# Patient Record
Sex: Male | Born: 1960 | Race: White | Hispanic: No | Marital: Married | Smoking: Never smoker
Health system: Southern US, Community
[De-identification: ages and names within clinical notes are randomized; demographics above are authoritative.]

## PROBLEM LIST (undated history)

## (undated) ENCOUNTER — Ambulatory Visit: Admission: EM

## (undated) DIAGNOSIS — G3185 Corticobasal degeneration: Secondary | ICD-10-CM

## (undated) DIAGNOSIS — K219 Gastro-esophageal reflux disease without esophagitis: Secondary | ICD-10-CM

## (undated) DIAGNOSIS — I10 Essential (primary) hypertension: Secondary | ICD-10-CM

## (undated) DIAGNOSIS — M2550 Pain in unspecified joint: Secondary | ICD-10-CM

## (undated) HISTORY — DX: Corticobasal degeneration: G31.85

## (undated) HISTORY — DX: Pain in unspecified joint: M25.50

## (undated) HISTORY — PX: HERNIA REPAIR: SHX51

## (undated) HISTORY — DX: Gastro-esophageal reflux disease without esophagitis: K21.9

---

## 2007-03-17 ENCOUNTER — Ambulatory Visit: Payer: Self-pay | Admitting: Surgery

## 2007-03-24 ENCOUNTER — Ambulatory Visit: Payer: Self-pay | Admitting: Surgery

## 2008-09-21 ENCOUNTER — Ambulatory Visit: Payer: Self-pay | Admitting: Internal Medicine

## 2012-12-20 ENCOUNTER — Ambulatory Visit: Payer: Self-pay | Admitting: Physician Assistant

## 2019-09-03 ENCOUNTER — Other Ambulatory Visit: Payer: Self-pay

## 2019-09-03 ENCOUNTER — Ambulatory Visit
Admission: EM | Admit: 2019-09-03 | Discharge: 2019-09-03 | Disposition: A | Discharge status: Home / Self Care | Payer: BC Managed Care – PPO | Attending: Physician Assistant | Admitting: Physician Assistant

## 2019-09-03 DIAGNOSIS — Z20822 Contact with and (suspected) exposure to covid-19: Secondary | ICD-10-CM

## 2019-09-03 LAB — SARS CORONAVIRUS 2 (TAT 6-24 HRS): SARS Coronavirus 2: NEGATIVE

## 2019-09-03 NOTE — Discharge Instructions (Signed)
Test will return in 6-24 hours. You will not contacted for negative results. Continue to quarantine until results are available.

## 2019-09-03 NOTE — ED Triage Notes (Signed)
Pt present to [facility] for covid testing. He states his grandson tested positive and he lives in his home. Pt is Asymptotic.

## 2020-04-13 DIAGNOSIS — I10 Essential (primary) hypertension: Secondary | ICD-10-CM | POA: Insufficient documentation

## 2020-05-26 DIAGNOSIS — K219 Gastro-esophageal reflux disease without esophagitis: Secondary | ICD-10-CM | POA: Insufficient documentation

## 2020-10-27 ENCOUNTER — Other Ambulatory Visit: Payer: Self-pay

## 2020-10-27 ENCOUNTER — Encounter: Payer: Self-pay | Admitting: Emergency Medicine

## 2020-10-27 ENCOUNTER — Ambulatory Visit
Admission: EM | Admit: 2020-10-27 | Discharge: 2020-10-27 | Disposition: A | Discharge status: Home / Self Care | Payer: BC Managed Care – PPO

## 2020-10-27 DIAGNOSIS — H532 Diplopia: Secondary | ICD-10-CM

## 2020-10-27 DIAGNOSIS — Z87828 Personal history of other (healed) physical injury and trauma: Secondary | ICD-10-CM | POA: Diagnosis not present

## 2020-10-27 DIAGNOSIS — R42 Dizziness and giddiness: Secondary | ICD-10-CM | POA: Diagnosis not present

## 2020-10-27 HISTORY — DX: Essential (primary) hypertension: I10
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
## 2020-10-27 NOTE — Discharge Instructions (Signed)
Please go back to the ER to have further work up Make sure you follow up with [employer] comp clinic your company prefers. We dont do worker's comp follow ups here.
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
## 2020-10-27 NOTE — ED Notes (Signed)
Patient is being discharged from the Urgent Care and sent to the Emergency Department via POV . Per Beatrix Fetters, PA, patient is in need of higher level of care due to Blurred vision and recent head trauma. Patient is aware and verbalizes understanding of plan of care.  Vitals:   10/27/20 0909  BP: (!) 162/107  Pulse: 63  Resp: 18  Temp: 98.1 F (36.7 C)  SpO2: 97%

## 2021-01-22 DIAGNOSIS — M179 Osteoarthritis of knee, unspecified: Secondary | ICD-10-CM | POA: Insufficient documentation

## 2021-02-12 DIAGNOSIS — R7303 Prediabetes: Secondary | ICD-10-CM | POA: Insufficient documentation

## 2021-02-17 DIAGNOSIS — M1712 Unilateral primary osteoarthritis, left knee: Secondary | ICD-10-CM | POA: Insufficient documentation

## 2021-02-17 DIAGNOSIS — M545 Low back pain, unspecified: Secondary | ICD-10-CM | POA: Insufficient documentation

## 2021-04-02 ENCOUNTER — Encounter: Payer: Self-pay | Admitting: *Deleted

## 2021-04-03 ENCOUNTER — Ambulatory Visit: Payer: BC Managed Care – PPO | Admitting: Neurology

## 2021-04-03 ENCOUNTER — Encounter: Payer: Self-pay | Admitting: Neurology

## 2021-04-03 VITALS — BP 150/104 | HR 80 | Ht 66.0 in | Wt 176.0 lb

## 2021-04-03 DIAGNOSIS — R269 Unspecified abnormalities of gait and mobility: Secondary | ICD-10-CM

## 2021-04-03 DIAGNOSIS — G8194 Hemiplegia, unspecified affecting left nondominant side: Secondary | ICD-10-CM | POA: Diagnosis not present
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
## 2021-04-06 ENCOUNTER — Telehealth: Payer: Self-pay | Admitting: Neurology

## 2021-04-06 NOTE — Telephone Encounter (Signed)
At 1:59 pt left a vm asking for a call to discuss returning to work before PT is over, please call. ?

## 2021-04-07 NOTE — Telephone Encounter (Signed)
I spoke to the patient. He has only had one PT session. Continued weakness on left side and gait issues. Double vision has improved. He has not scheduled his MRI brain or MRI cervical yet (scans still pending approval). 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 For now, he should remain out of work in this position for safety reasons. He will move forward with the MRI's, PT and will keep his follow up on 05/18/21. He would like to speak more about work clearance at the next office visit.  ?

## 2021-04-09 ENCOUNTER — Ambulatory Visit: Payer: BC Managed Care – PPO

## 2021-04-13 ENCOUNTER — Encounter: Payer: Self-pay | Admitting: Physical Therapy

## 2021-04-13 ENCOUNTER — Other Ambulatory Visit: Payer: Self-pay

## 2021-04-13 ENCOUNTER — Ambulatory Visit: Payer: BC Managed Care – PPO | Attending: Neurology | Admitting: Physical Therapy

## 2021-04-13 ENCOUNTER — Telehealth: Payer: Self-pay | Admitting: Neurology

## 2021-04-13 DIAGNOSIS — R2689 Other abnormalities of gait and mobility: Secondary | ICD-10-CM | POA: Insufficient documentation

## 2021-04-13 DIAGNOSIS — R262 Difficulty in walking, not elsewhere classified: Secondary | ICD-10-CM | POA: Diagnosis present

## 2021-04-13 DIAGNOSIS — R269 Unspecified abnormalities of gait and mobility: Secondary | ICD-10-CM | POA: Diagnosis present

## 2021-04-13 DIAGNOSIS — G8194 Hemiplegia, unspecified affecting left nondominant side: Secondary | ICD-10-CM | POA: Insufficient documentation
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
## 2021-04-13 NOTE — Telephone Encounter (Signed)
spoke to the pt he would like to go to go closer to home sent to [facility]  ?[facility] Numbers: [id number] (exp. 04/08/21 to 05/07/21)  ?

## 2021-04-15 ENCOUNTER — Other Ambulatory Visit: Payer: Self-pay

## 2021-04-15 ENCOUNTER — Telehealth: Payer: Self-pay

## 2021-04-15 ENCOUNTER — Encounter: Payer: Self-pay | Admitting: Physical Therapy

## 2021-04-15 ENCOUNTER — Ambulatory Visit: Payer: BC Managed Care – PPO | Admitting: Physical Therapy

## 2021-04-15 DIAGNOSIS — R2689 Other abnormalities of gait and mobility: Secondary | ICD-10-CM

## 2021-04-15 DIAGNOSIS — R262 Difficulty in walking, not elsewhere classified: Secondary | ICD-10-CM

## 2021-04-15 DIAGNOSIS — Z0289 Encounter for other administrative examinations: Secondary | ICD-10-CM

## 2021-04-15 DIAGNOSIS — R269 Unspecified abnormalities of gait and mobility: Secondary | ICD-10-CM

## 2021-04-15 DIAGNOSIS — G8194 Hemiplegia, unspecified affecting left nondominant side: Secondary | ICD-10-CM | POA: Diagnosis not present

## 2021-04-15 NOTE — Telephone Encounter (Signed)
Completed [employer] forms have been copied and given to medical records for processing.  ?
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
## 2021-04-16 NOTE — Telephone Encounter (Signed)
Pt Dot form ready @ the front desk for p/u. ?

## 2021-04-20 ENCOUNTER — Ambulatory Visit: Payer: BC Managed Care – PPO | Attending: Neurology | Admitting: Physical Therapy

## 2021-04-20 ENCOUNTER — Encounter: Payer: Self-pay | Admitting: Physical Therapy

## 2021-04-20 DIAGNOSIS — R2689 Other abnormalities of gait and mobility: Secondary | ICD-10-CM | POA: Diagnosis present

## 2021-04-20 DIAGNOSIS — R269 Unspecified abnormalities of gait and mobility: Secondary | ICD-10-CM | POA: Insufficient documentation

## 2021-04-20 DIAGNOSIS — R262 Difficulty in walking, not elsewhere classified: Secondary | ICD-10-CM | POA: Insufficient documentation
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
## 2021-04-22 ENCOUNTER — Ambulatory Visit
Admission: RE | Admit: 2021-04-22 | Discharge: 2021-04-22 | Disposition: A | Discharge status: Home / Self Care | Payer: BC Managed Care – PPO | Source: Ambulatory Visit | Attending: Neurology | Admitting: Neurology

## 2021-04-22 ENCOUNTER — Telehealth: Payer: Self-pay | Admitting: Neurology

## 2021-04-22 ENCOUNTER — Encounter: Payer: BC Managed Care – PPO | Admitting: Physical Therapy

## 2021-04-22 DIAGNOSIS — G8194 Hemiplegia, unspecified affecting left nondominant side: Secondary | ICD-10-CM

## 2021-04-22 DIAGNOSIS — R269 Unspecified abnormalities of gait and mobility: Secondary | ICD-10-CM

## 2021-04-22 NOTE — Telephone Encounter (Signed)
Reviewed MRI of the brain and cervical spine showed no significant abnormalities. ? ?IMPRESSION: This MRI of the brain without contrast shows the following: ?1.   No acute findings or explanation for his symptoms. ?2.   Brain parenchyma is normal for age showing just a couple punctate T2/FLAIR hyperintense foci in the subcortical white matter of the frontal lobes that could be due to minimal chronic microvascular ischemic change. ?3.   Chronic right maxillary and ethmoid sinusitis. ?   ?IMPRESSION: This MRI of the cervical spine without contrast shows the following: ?1.   The spinal cord appears normal. ?2.   Mild multilevel degenerative changes as detailed above leading to mild spinal stenosis at C6-C7 and borderline spinal stenosis at C5-C6.  There is no nerve root compression at any of these levels. ?

## 2021-04-27 ENCOUNTER — Ambulatory Visit: Payer: BC Managed Care – PPO | Admitting: Physical Therapy

## 2021-04-27 ENCOUNTER — Encounter: Payer: Self-pay | Admitting: Physical Therapy

## 2021-04-27 DIAGNOSIS — R269 Unspecified abnormalities of gait and mobility: Secondary | ICD-10-CM | POA: Diagnosis not present

## 2021-04-27 DIAGNOSIS — R2689 Other abnormalities of gait and mobility: Secondary | ICD-10-CM

## 2021-04-27 DIAGNOSIS — R262 Difficulty in walking, not elsewhere classified: Secondary | ICD-10-CM
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
## 2021-04-28 ENCOUNTER — Other Ambulatory Visit: Payer: BC Managed Care – PPO

## 2021-04-29 ENCOUNTER — Encounter: Payer: Self-pay | Admitting: Physical Therapy

## 2021-04-29 ENCOUNTER — Ambulatory Visit: Payer: BC Managed Care – PPO | Admitting: Physical Therapy

## 2021-04-29 DIAGNOSIS — R262 Difficulty in walking, not elsewhere classified: Secondary | ICD-10-CM

## 2021-04-29 DIAGNOSIS — R269 Unspecified abnormalities of gait and mobility: Secondary | ICD-10-CM | POA: Diagnosis not present

## 2021-04-29 DIAGNOSIS — R2689 Other abnormalities of gait and mobility: Secondary | ICD-10-CM
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
## 2021-05-04 ENCOUNTER — Encounter: Payer: Self-pay | Admitting: Physical Therapy

## 2021-05-04 ENCOUNTER — Ambulatory Visit: Payer: BC Managed Care – PPO | Admitting: Physical Therapy

## 2021-05-04 DIAGNOSIS — R262 Difficulty in walking, not elsewhere classified: Secondary | ICD-10-CM

## 2021-05-04 DIAGNOSIS — R2689 Other abnormalities of gait and mobility: Secondary | ICD-10-CM

## 2021-05-04 DIAGNOSIS — R269 Unspecified abnormalities of gait and mobility: Secondary | ICD-10-CM | POA: Diagnosis not present
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
## 2021-05-06 ENCOUNTER — Ambulatory Visit: Payer: BC Managed Care – PPO | Admitting: Physical Therapy

## 2021-05-06 ENCOUNTER — Encounter: Payer: Self-pay | Admitting: Physical Therapy

## 2021-05-06 DIAGNOSIS — R269 Unspecified abnormalities of gait and mobility: Secondary | ICD-10-CM

## 2021-05-06 DIAGNOSIS — R2689 Other abnormalities of gait and mobility: Secondary | ICD-10-CM

## 2021-05-06 DIAGNOSIS — R262 Difficulty in walking, not elsewhere classified: Secondary | ICD-10-CM
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
## 2021-05-11 ENCOUNTER — Encounter: Payer: BC Managed Care – PPO | Admitting: Physical Therapy

## 2021-05-12 ENCOUNTER — Encounter: Payer: Self-pay | Admitting: Physical Therapy

## 2021-05-12 ENCOUNTER — Ambulatory Visit: Payer: BC Managed Care – PPO | Admitting: Physical Therapy

## 2021-05-12 DIAGNOSIS — R269 Unspecified abnormalities of gait and mobility: Secondary | ICD-10-CM

## 2021-05-12 DIAGNOSIS — R262 Difficulty in walking, not elsewhere classified: Secondary | ICD-10-CM

## 2021-05-12 DIAGNOSIS — R2689 Other abnormalities of gait and mobility: Secondary | ICD-10-CM
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
## 2021-05-13 ENCOUNTER — Ambulatory Visit: Payer: BC Managed Care – PPO | Admitting: Physical Therapy

## 2021-05-13 ENCOUNTER — Encounter: Payer: Self-pay | Admitting: Physical Therapy

## 2021-05-13 VITALS — BP 133/97 | HR 81

## 2021-05-13 DIAGNOSIS — R262 Difficulty in walking, not elsewhere classified: Secondary | ICD-10-CM

## 2021-05-13 DIAGNOSIS — R2689 Other abnormalities of gait and mobility: Secondary | ICD-10-CM

## 2021-05-13 DIAGNOSIS — R269 Unspecified abnormalities of gait and mobility: Secondary | ICD-10-CM | POA: Diagnosis not present
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
## 2021-05-18 ENCOUNTER — Ambulatory Visit: Payer: BC Managed Care – PPO | Attending: Neurology | Admitting: Physical Therapy

## 2021-05-18 ENCOUNTER — Encounter: Payer: Self-pay | Admitting: Neurology

## 2021-05-18 ENCOUNTER — Encounter: Payer: Self-pay | Admitting: Physical Therapy

## 2021-05-18 ENCOUNTER — Ambulatory Visit: Payer: BC Managed Care – PPO | Admitting: Neurology

## 2021-05-18 VITALS — BP 161/97 | HR 76 | Ht 66.0 in | Wt 171.5 lb

## 2021-05-18 DIAGNOSIS — G8194 Hemiplegia, unspecified affecting left nondominant side: Secondary | ICD-10-CM | POA: Diagnosis not present

## 2021-05-18 DIAGNOSIS — R269 Unspecified abnormalities of gait and mobility: Secondary | ICD-10-CM

## 2021-05-18 DIAGNOSIS — R2689 Other abnormalities of gait and mobility: Secondary | ICD-10-CM | POA: Diagnosis present

## 2021-05-18 DIAGNOSIS — G20 Parkinson's disease: Secondary | ICD-10-CM | POA: Diagnosis not present

## 2021-05-18 DIAGNOSIS — R262 Difficulty in walking, not elsewhere classified: Secondary | ICD-10-CM | POA: Insufficient documentation
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
## 2021-05-19 DIAGNOSIS — Z0289 Encounter for other administrative examinations: Secondary | ICD-10-CM

## 2021-05-20 ENCOUNTER — Encounter: Payer: Self-pay | Admitting: Physical Therapy

## 2021-05-20 ENCOUNTER — Ambulatory Visit: Payer: BC Managed Care – PPO | Admitting: Physical Therapy

## 2021-05-20 DIAGNOSIS — R269 Unspecified abnormalities of gait and mobility: Secondary | ICD-10-CM | POA: Diagnosis not present

## 2021-05-20 DIAGNOSIS — R262 Difficulty in walking, not elsewhere classified: Secondary | ICD-10-CM

## 2021-05-20 DIAGNOSIS — R2689 Other abnormalities of gait and mobility: Secondary | ICD-10-CM
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
## 2021-05-21 ENCOUNTER — Telehealth: Payer: Self-pay | Admitting: *Deleted

## 2021-05-21 ENCOUNTER — Telehealth: Payer: Self-pay | Admitting: Neurology

## 2021-05-21 NOTE — Telephone Encounter (Signed)
Please call patient, laboratory evaluation showed ? ?-- Mild elevated IgM, unknown clinical significance ? ?--Mildly decreased vitamin D 26.5, he would benefit over-the-counter D3 supplement, 1000 units daily ? ?--Mild elevated CPK 547, unknown clinical significance, encouraging increase water intake, will consider repeat CPK at next office visit ? ?Rest of the laboratory evaluation showed no significant abnormalities. ? ?

## 2021-05-21 NOTE — Telephone Encounter (Signed)
Spoke with pt and provided results. He was agreeable to starting Vitamin D 1000 units daily. Informed pt to increase water intake. He verbalized understanding and expressed appreciation for the call. ?

## 2021-05-21 NOTE — Telephone Encounter (Signed)
Pt fmla form @ front desk for p/u 

## 2021-05-25 ENCOUNTER — Encounter: Payer: Self-pay | Admitting: Physical Therapy

## 2021-05-25 ENCOUNTER — Telehealth: Payer: Self-pay | Admitting: Neurology

## 2021-05-25 ENCOUNTER — Ambulatory Visit: Payer: BC Managed Care – PPO | Admitting: Physical Therapy

## 2021-05-25 DIAGNOSIS — R262 Difficulty in walking, not elsewhere classified: Secondary | ICD-10-CM

## 2021-05-25 DIAGNOSIS — R2689 Other abnormalities of gait and mobility: Secondary | ICD-10-CM

## 2021-05-25 DIAGNOSIS — G20 Parkinson's disease: Secondary | ICD-10-CM | POA: Insufficient documentation

## 2021-05-25 DIAGNOSIS — R269 Unspecified abnormalities of gait and mobility: Secondary | ICD-10-CM | POA: Diagnosis not present

## 2021-05-25 NOTE — Telephone Encounter (Signed)
Datscan to evaluation left side difficulty rule out dopamine deficiency ?
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
## 2021-05-25 NOTE — Telephone Encounter (Signed)
[employer] Berkley Harvey: [id number] (exp. 05/25/21 to 06/23/21) order sent to GI, they will reach out to the patient to schedule.  ?

## 2021-05-27 ENCOUNTER — Ambulatory Visit: Payer: BC Managed Care – PPO | Admitting: Physical Therapy

## 2021-05-27 ENCOUNTER — Encounter: Payer: Self-pay | Admitting: Physical Therapy

## 2021-05-27 DIAGNOSIS — R262 Difficulty in walking, not elsewhere classified: Secondary | ICD-10-CM

## 2021-05-27 DIAGNOSIS — R269 Unspecified abnormalities of gait and mobility: Secondary | ICD-10-CM | POA: Diagnosis not present

## 2021-05-27 DIAGNOSIS — R2689 Other abnormalities of gait and mobility: Secondary | ICD-10-CM
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
## 2021-06-01 NOTE — Telephone Encounter (Signed)
Ezequiel Essex Berkley Harvey: [phone] (exp. 06/01/21 to 06/30/21). Sent to Nuclear medicine [facility] will reach out to the patient to schedule.  ?

## 2021-06-02 ENCOUNTER — Ambulatory Visit (INDEPENDENT_AMBULATORY_CARE_PROVIDER_SITE_OTHER): Payer: BC Managed Care – PPO | Admitting: Neurology

## 2021-06-02 ENCOUNTER — Ambulatory Visit: Payer: BC Managed Care – PPO | Admitting: Neurology

## 2021-06-02 ENCOUNTER — Ambulatory Visit: Payer: BC Managed Care – PPO | Admitting: Physical Therapy

## 2021-06-02 VITALS — BP 174/100 | HR 96

## 2021-06-02 DIAGNOSIS — Z0289 Encounter for other administrative examinations: Secondary | ICD-10-CM

## 2021-06-02 DIAGNOSIS — G20 Parkinson's disease: Secondary | ICD-10-CM

## 2021-06-02 DIAGNOSIS — R269 Unspecified abnormalities of gait and mobility: Secondary | ICD-10-CM

## 2021-06-02 DIAGNOSIS — G8194 Hemiplegia, unspecified affecting left nondominant side: Secondary | ICD-10-CM | POA: Diagnosis not present

## 2021-06-02 DIAGNOSIS — R748 Abnormal levels of other serum enzymes: Secondary | ICD-10-CM | POA: Diagnosis not present

## 2021-06-02 MED ORDER — CARBIDOPA-LEVODOPA 25-100 MG PO TABS: 1.0000 | ORAL_TABLET | Freq: Three times a day (TID) | ORAL | 6 refills | Status: AC
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
## 2021-06-02 NOTE — Procedures (Signed)
? ? ? ?   ?Full Name: Wyatt Medina Gender: Male ?MRN #: [id number] Date of Birth: 07/29/1960 ?   ?Visit Date: 06/02/2021 13:38 ?Age: 61 Years ?Examining Physician: Levert Feinstein ?Referring Physician: Levert Feinstein ?Height: 5 feet 6 inch ?Patient History: Elevated CPK, weakness ?History: 61 year old male with elevated CPK, slow worsening left upper and lower extremity weakness ? ?Summary of the test: ?Nerve conduction study: ?Left ulnar sensory and motor responses were normal.  Left median sensory response showed mildly prolonged peak latency with normal snap amplitude.  Left median motor responses were normal. ? ?Left sural, superficial peroneal sensory responses were normal.  Left tibial motor responses were normal. ? ?Electromyography: ?Selected needle examinations of left upper, lower extremity muscles, left cervical and lumbosacral paraspinal muscles were normal. ? ?Conclusion: ?This is a mild abnormal study.  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  There is no evidence of intrinsic muscle disease, left cervical lumbar sacral radiculopathy. ? ? ? ?------------------------------- ?Levert Feinstein, M.D. PhD ? ?[facility] ?[address], [address] ?[place], [place] [address] ?Tel: [phone] ?Fax: [phone] ? ?Verbal informed consent was obtained from the patient, patient was informed of potential risk of procedure, including bruising, bleeding, hematoma formation, infection, muscle weakness, muscle pain, numbness, among others. ?   ? ?   ?MNC ?   ?Nerve / Sites Muscle Latency Ref. Amplitude Ref. Rel Amp Segments Distance Velocity Ref. Area  ?  ms ms mV mV %  cm m/s m/s mVms  ?L Median - APB  ?   Wrist APB 4.1 ?4.4 8.9 ?4.0 100 Wrist - APB 7   29.1  ?   Upper arm APB 8.3  8.5  96.2 Upper arm - Wrist 22 52 ?49 28.9  ?L Ulnar - ADM  ?   Wrist ADM 2.5 ?3.3 6.3 ?6.0 100 Wrist - ADM 7   19.8  ?   B.Elbow ADM 4.7  5.9   94.1 B.Elbow - Wrist 13 61 ?49 21.2  ?   A.Elbow ADM 7.9  6.3  107 A.Elbow - B.Elbow 18 56 ?49 23.4  ?L Tibial - AH  ?   Ankle AH 5.4 ?5.8 11.8 ?4.0 100 Ankle - AH 9   31.8  ?   Pop fossa AH 15.1  8.6  72.6 Pop fossa - Ankle 44 46 ?41 27.6  ?         ?SNC ?   ?Nerve / Sites Rec. Site Peak Lat Ref.  Amp Ref. Segments Distance  ?  ms ms ?V ?V  cm  ?L Sural - Ankle (Calf)  ?   Calf Ankle 4.3 ?4.4 10 ?6 Calf - Ankle 14  ?L Superficial peroneal - Ankle  ?   Lat leg Ankle 4.2 ?4.4 7 ?6 Lat leg - Ankle 14  ?L Median - Orthodromic (Dig II, Mid palm)  ?   Dig II Wrist 3.7 ?3.4 11 ?10 Dig II - Wrist 13  ?L Ulnar - Orthodromic, (Dig V, Mid palm)  ?   Dig V Wrist 2.7 ?3.1 11 ?5 Dig V - Wrist 11  ?           ?F  Wave ?   ?Nerve F Lat Ref.  ? ms ms  ?L Ulnar - ADM 25.1 ?32.0  ?L Tibial - AH 54.3 ?56.0  ?       ?EMG Summary Table   ? Spontaneous MUAP Recruitment  ?  Muscle IA Fib PSW Fasc Other Amp Dur. Poly Pattern  ?L. Tibialis posterior Normal None None None _______ Normal Normal Normal Normal  ?L. Tibialis anterior Normal None None None _______ Normal Normal Normal Normal  ?L. Peroneus longus Normal None None None _______ Normal Normal Normal Normal  ?L. Gastrocnemius (Medial head) Normal None None None _______ Normal Normal Normal Normal  ?L. Vastus lateralis Normal None None None _______ Normal Normal Normal Normal  ?L. Lumbar paraspinals (low) Normal None None None _______ Normal Normal Normal Normal  ?L. Lumbar paraspinals (mid) Normal None None None _______ Normal Normal Normal Normal  ?L. First dorsal interosseous Normal None None None _______ Normal Normal Normal Normal  ?L. Brachioradialis Normal None None None _______ Normal Normal Normal Normal  ?L. Pronator teres Normal None None None _______ Normal Normal Normal Normal  ?L. Biceps brachii Normal None None None _______ Normal Normal Normal Normal  ?L. Deltoid Normal None None None _______ Normal Normal Normal Normal  ?L. Triceps brachii Normal None None None _______  Normal Normal Normal Normal  ?L. Cervical paraspinals Normal None None None _______ Normal Normal Normal Normal  ? ?  ?

## 2021-06-03 ENCOUNTER — Telehealth: Payer: Self-pay

## 2021-06-03 ENCOUNTER — Ambulatory Visit
Admission: RE | Admit: 2021-06-03 | Discharge: 2021-06-03 | Disposition: A | Discharge status: Home / Self Care | Payer: BC Managed Care – PPO | Source: Ambulatory Visit | Attending: Neurology | Admitting: Neurology

## 2021-06-03 DIAGNOSIS — G8194 Hemiplegia, unspecified affecting left nondominant side: Secondary | ICD-10-CM

## 2021-06-03 DIAGNOSIS — R269 Unspecified abnormalities of gait and mobility: Secondary | ICD-10-CM

## 2021-06-03 LAB — CK: Total CK: 632 U/L (ref 41–331)

## 2021-06-03 NOTE — Telephone Encounter (Signed)
Patients [place] disability form completed and faxed ?

## 2021-06-04 ENCOUNTER — Encounter: Payer: Self-pay | Admitting: Physical Therapy

## 2021-06-04 ENCOUNTER — Ambulatory Visit: Payer: BC Managed Care – PPO | Admitting: Physical Therapy

## 2021-06-04 DIAGNOSIS — R269 Unspecified abnormalities of gait and mobility: Secondary | ICD-10-CM

## 2021-06-04 DIAGNOSIS — R2689 Other abnormalities of gait and mobility: Secondary | ICD-10-CM

## 2021-06-04 DIAGNOSIS — R262 Difficulty in walking, not elsewhere classified: Secondary | ICD-10-CM
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
## 2021-06-08 ENCOUNTER — Ambulatory Visit: Payer: BC Managed Care – PPO | Admitting: Physical Therapy

## 2021-06-08 ENCOUNTER — Encounter: Payer: Self-pay | Admitting: Physical Therapy

## 2021-06-08 DIAGNOSIS — R269 Unspecified abnormalities of gait and mobility: Secondary | ICD-10-CM | POA: Diagnosis not present

## 2021-06-08 DIAGNOSIS — R262 Difficulty in walking, not elsewhere classified: Secondary | ICD-10-CM

## 2021-06-08 DIAGNOSIS — R2689 Other abnormalities of gait and mobility: Secondary | ICD-10-CM
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
## 2021-06-10 ENCOUNTER — Ambulatory Visit: Payer: BC Managed Care – PPO | Admitting: Physical Therapy

## 2021-06-10 ENCOUNTER — Encounter: Payer: Self-pay | Admitting: Physical Therapy

## 2021-06-10 DIAGNOSIS — R269 Unspecified abnormalities of gait and mobility: Secondary | ICD-10-CM | POA: Diagnosis not present

## 2021-06-10 DIAGNOSIS — R262 Difficulty in walking, not elsewhere classified: Secondary | ICD-10-CM

## 2021-06-10 DIAGNOSIS — R2689 Other abnormalities of gait and mobility: Secondary | ICD-10-CM
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
## 2021-06-12 LAB — MULTIPLE MYELOMA PANEL, SERUM
Albumin SerPl Elph-Mcnc: 4.1 g/dL (ref 2.9–4.4)
Albumin/Glob SerPl: 1.3 (ref 0.7–1.7)
Alpha 1: 0.3 g/dL (ref 0.0–0.4)
Alpha2 Glob SerPl Elph-Mcnc: 0.7 g/dL (ref 0.4–1.0)
B-Globulin SerPl Elph-Mcnc: 1.2 g/dL (ref 0.7–1.3)
Gamma Glob SerPl Elph-Mcnc: 1.2 g/dL (ref 0.4–1.8)
Globulin, Total: 3.4 g/dL (ref 2.2–3.9)
IgA/Immunoglobulin A, Serum: 226 mg/dL (ref 90–386)
IgG (Immunoglobin G), Serum: 1166 mg/dL (ref 603–1613)
IgM (Immunoglobulin M), Srm: 197 mg/dL — ABNORMAL HIGH (ref 20–172)
Total Protein: 7.5 g/dL (ref 6.0–8.5)

## 2021-06-12 LAB — THYROID PANEL WITH TSH
Free Thyroxine Index: 1.7 (ref 1.2–4.9)
T3 Uptake Ratio: 23 % — ABNORMAL LOW (ref 24–39)
T4, Total: 7.5 ug/dL (ref 4.5–12.0)
TSH: 3.83 u[IU]/mL (ref 0.450–4.500)

## 2021-06-12 LAB — VITAMIN B12: Vitamin B-12: 367 pg/mL (ref 232–1245)

## 2021-06-12 LAB — ACETYLCHOLINE RECEPTOR AB, ALL
AChR Binding Ab, Serum: 0.18 nmol/L (ref 0.00–0.24)
AChR-modulating Ab: 17 % (ref 0–45)
Acetylchol Block Ab: 19 % (ref 0–25)

## 2021-06-12 LAB — CK: Total CK: 547 U/L (ref 41–331)

## 2021-06-12 LAB — HIV ANTIBODY (ROUTINE TESTING W REFLEX): HIV Screen 4th Generation wRfx: NONREACTIVE

## 2021-06-12 LAB — LYME DISEASE SEROLOGY W/REFLEX: Lyme Total Antibody EIA: NEGATIVE

## 2021-06-12 LAB — SEDIMENTATION RATE: Sed Rate: 2 mm/hr (ref 0–30)

## 2021-06-12 LAB — RPR: RPR Ser Ql: NONREACTIVE

## 2021-06-12 LAB — C-REACTIVE PROTEIN: CRP: <1 mg/L (ref 0–10)

## 2021-06-12 LAB — ANA W/REFLEX: Anti Nuclear Antibody (ANA): NEGATIVE

## 2021-06-12 LAB — VITAMIN D 25 HYDROXY (VIT D DEFICIENCY, FRACTURES): Vit D, 25-Hydroxy: 26.5 ng/mL — ABNORMAL LOW (ref 30.0–100.0)

## 2021-06-12 LAB — CERULOPLASMIN: Ceruloplasmin: 22 mg/dL (ref 16.0–31.0)

## 2021-06-16 ENCOUNTER — Ambulatory Visit: Payer: BC Managed Care – PPO | Admitting: Physical Therapy

## 2021-06-16 ENCOUNTER — Encounter: Payer: Self-pay | Admitting: Physical Therapy

## 2021-06-16 DIAGNOSIS — R262 Difficulty in walking, not elsewhere classified: Secondary | ICD-10-CM

## 2021-06-16 DIAGNOSIS — R269 Unspecified abnormalities of gait and mobility: Secondary | ICD-10-CM | POA: Diagnosis not present

## 2021-06-16 DIAGNOSIS — R2689 Other abnormalities of gait and mobility: Secondary | ICD-10-CM
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
## 2021-06-18 ENCOUNTER — Ambulatory Visit: Payer: BC Managed Care – PPO | Attending: Neurology | Admitting: Physical Therapy

## 2021-06-18 ENCOUNTER — Encounter: Payer: Self-pay | Admitting: Physical Therapy

## 2021-06-18 DIAGNOSIS — R269 Unspecified abnormalities of gait and mobility: Secondary | ICD-10-CM | POA: Insufficient documentation

## 2021-06-18 DIAGNOSIS — R2689 Other abnormalities of gait and mobility: Secondary | ICD-10-CM | POA: Insufficient documentation

## 2021-06-18 DIAGNOSIS — R262 Difficulty in walking, not elsewhere classified: Secondary | ICD-10-CM | POA: Diagnosis present
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
## 2021-06-22 ENCOUNTER — Encounter: Payer: Self-pay | Admitting: Physical Therapy

## 2021-06-22 ENCOUNTER — Ambulatory Visit: Payer: BC Managed Care – PPO | Admitting: Physical Therapy

## 2021-06-22 DIAGNOSIS — R262 Difficulty in walking, not elsewhere classified: Secondary | ICD-10-CM

## 2021-06-22 DIAGNOSIS — R2689 Other abnormalities of gait and mobility: Secondary | ICD-10-CM

## 2021-06-22 DIAGNOSIS — R269 Unspecified abnormalities of gait and mobility: Secondary | ICD-10-CM
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
## 2021-06-23 ENCOUNTER — Encounter (HOSPITAL_COMMUNITY)
Admission: RE | Admit: 2021-06-23 | Discharge: 2021-06-23 | Disposition: A | Discharge status: Home / Self Care | Payer: BC Managed Care – PPO | Source: Ambulatory Visit | Attending: Neurology | Admitting: Neurology

## 2021-06-23 DIAGNOSIS — G8194 Hemiplegia, unspecified affecting left nondominant side: Secondary | ICD-10-CM | POA: Diagnosis present

## 2021-06-23 DIAGNOSIS — R2689 Other abnormalities of gait and mobility: Secondary | ICD-10-CM | POA: Insufficient documentation

## 2021-06-23 DIAGNOSIS — R531 Weakness: Secondary | ICD-10-CM | POA: Insufficient documentation

## 2021-06-23 DIAGNOSIS — R269 Unspecified abnormalities of gait and mobility: Secondary | ICD-10-CM | POA: Diagnosis present

## 2021-06-23 MED ORDER — POTASSIUM IODIDE (ANTIDOTE) 130 MG PO TABS
ORAL_TABLET | ORAL | Status: AC
  Filled 2021-06-23: qty 1

## 2021-06-24 MED ORDER — IOFLUPANE I 123 185 MBQ/2.5ML IV SOLN
5.0000 | Freq: Once | INTRAVENOUS | Status: AC | PRN
Start: 2021-06-24 — End: 2021-06-24
  Administered 2021-06-24: 5 via INTRAVENOUS
  Filled 2021-06-24: qty 5

## 2021-06-25 ENCOUNTER — Ambulatory Visit: Payer: BC Managed Care – PPO | Admitting: Physical Therapy

## 2021-06-25 DIAGNOSIS — R2689 Other abnormalities of gait and mobility: Secondary | ICD-10-CM

## 2021-06-25 DIAGNOSIS — R269 Unspecified abnormalities of gait and mobility: Secondary | ICD-10-CM

## 2021-06-25 DIAGNOSIS — R262 Difficulty in walking, not elsewhere classified: Secondary | ICD-10-CM
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
## 2021-06-28 ENCOUNTER — Encounter: Payer: Self-pay | Admitting: Physical Therapy

## 2021-06-29 ENCOUNTER — Ambulatory Visit: Payer: BC Managed Care – PPO | Admitting: Physical Therapy

## 2021-06-29 DIAGNOSIS — R269 Unspecified abnormalities of gait and mobility: Secondary | ICD-10-CM

## 2021-06-29 DIAGNOSIS — R2689 Other abnormalities of gait and mobility: Secondary | ICD-10-CM

## 2021-06-29 DIAGNOSIS — R262 Difficulty in walking, not elsewhere classified: Secondary | ICD-10-CM
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
## 2021-07-01 ENCOUNTER — Encounter: Payer: Self-pay | Admitting: Physical Therapy

## 2021-07-02 ENCOUNTER — Ambulatory Visit: Payer: BC Managed Care – PPO | Admitting: Physical Therapy

## 2021-07-02 ENCOUNTER — Encounter: Payer: Self-pay | Admitting: Physical Therapy

## 2021-07-02 DIAGNOSIS — R269 Unspecified abnormalities of gait and mobility: Secondary | ICD-10-CM

## 2021-07-02 DIAGNOSIS — R2689 Other abnormalities of gait and mobility: Secondary | ICD-10-CM

## 2021-07-02 DIAGNOSIS — R262 Difficulty in walking, not elsewhere classified: Secondary | ICD-10-CM
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
## 2021-07-06 ENCOUNTER — Encounter: Payer: Self-pay | Admitting: Physical Therapy

## 2021-07-06 ENCOUNTER — Ambulatory Visit: Payer: BC Managed Care – PPO | Admitting: Physical Therapy

## 2021-07-06 DIAGNOSIS — R262 Difficulty in walking, not elsewhere classified: Secondary | ICD-10-CM

## 2021-07-06 DIAGNOSIS — R269 Unspecified abnormalities of gait and mobility: Secondary | ICD-10-CM | POA: Diagnosis not present

## 2021-07-06 DIAGNOSIS — R2689 Other abnormalities of gait and mobility: Secondary | ICD-10-CM
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
## 2021-07-08 ENCOUNTER — Ambulatory Visit: Payer: BC Managed Care – PPO | Admitting: Physical Therapy

## 2021-07-08 ENCOUNTER — Telehealth: Payer: Self-pay | Admitting: *Deleted

## 2021-07-08 DIAGNOSIS — R262 Difficulty in walking, not elsewhere classified: Secondary | ICD-10-CM

## 2021-07-08 DIAGNOSIS — R2689 Other abnormalities of gait and mobility: Secondary | ICD-10-CM

## 2021-07-08 DIAGNOSIS — R269 Unspecified abnormalities of gait and mobility: Secondary | ICD-10-CM

## 2021-07-08 NOTE — Telephone Encounter (Signed)
[place] disability form completed and faxed.
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
## 2021-07-11 ENCOUNTER — Encounter: Payer: Self-pay | Admitting: Physical Therapy

## 2021-07-13 ENCOUNTER — Encounter: Payer: Self-pay | Admitting: Physical Therapy

## 2021-07-13 ENCOUNTER — Ambulatory Visit: Payer: BC Managed Care – PPO | Admitting: Physical Therapy

## 2021-07-13 DIAGNOSIS — R2689 Other abnormalities of gait and mobility: Secondary | ICD-10-CM

## 2021-07-13 DIAGNOSIS — R269 Unspecified abnormalities of gait and mobility: Secondary | ICD-10-CM | POA: Diagnosis not present

## 2021-07-13 DIAGNOSIS — R262 Difficulty in walking, not elsewhere classified: Secondary | ICD-10-CM

## 2021-07-15 ENCOUNTER — Ambulatory Visit: Payer: BC Managed Care – PPO | Admitting: Physical Therapy

## 2021-07-15 ENCOUNTER — Encounter: Payer: Self-pay | Admitting: Physical Therapy

## 2021-07-15 DIAGNOSIS — R262 Difficulty in walking, not elsewhere classified: Secondary | ICD-10-CM

## 2021-07-15 DIAGNOSIS — R269 Unspecified abnormalities of gait and mobility: Secondary | ICD-10-CM

## 2021-07-15 DIAGNOSIS — R2689 Other abnormalities of gait and mobility: Secondary | ICD-10-CM
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
## 2021-07-19 ENCOUNTER — Encounter (INDEPENDENT_AMBULATORY_CARE_PROVIDER_SITE_OTHER): Payer: BC Managed Care – PPO | Admitting: Neurology

## 2021-07-19 DIAGNOSIS — R269 Unspecified abnormalities of gait and mobility: Secondary | ICD-10-CM

## 2021-07-19 DIAGNOSIS — G20C Parkinsonism, unspecified: Secondary | ICD-10-CM

## 2021-07-19 DIAGNOSIS — G20 Parkinson's disease: Secondary | ICD-10-CM

## 2021-07-20 NOTE — Telephone Encounter (Signed)
  Thank you for your message seeking medical advice.* My assessment and recommendation are as follows:  Mr. Beehler:  I think you have questions about the DatScan report, which did show marked decreased radiotracer activity in the right striatum.  This reflects the decreased dopaminergic activities. The abnormal DatScan is not only seen in typical Parkinson's disease, can be seen in other dopaminergic neurodegenerative disorders as well.   How you respond to Sinemet will also help [place] clarify diagnosis.   I will see you in follow up visit.  IMPRESSION: Marked decreased radiotracer activity in the RIGHT striatum. This pattern has been associated with Parkinsonian syndrome pathology.   Of note, DaTSCAN is not diagnostic of Parkinsonian syndromes, which remains a clinical diagnosis. DaTscan is an adjuvant test to aid in the clinical diagnosis of Parkinsonian syndromes.   Sincerely,  Levert Feinstein, MD PhD.   *This exchange required the expertise of a doctor, nurse practitioner, physician assistant, optometrist or certified nurse midwife and qualifies as a Medical Advice Message, please visit [email or web address] for more details. Helper will bill your insurance on your behalf; copays and deductibles may apply. Questions? Reply to this message.

## 2021-07-22 DIAGNOSIS — Z0289 Encounter for other administrative examinations: Secondary | ICD-10-CM

## 2021-07-27 ENCOUNTER — Ambulatory Visit: Payer: BC Managed Care – PPO | Attending: Neurology | Admitting: Physical Therapy

## 2021-07-27 ENCOUNTER — Encounter: Payer: Self-pay | Admitting: Physical Therapy

## 2021-07-27 DIAGNOSIS — R262 Difficulty in walking, not elsewhere classified: Secondary | ICD-10-CM | POA: Insufficient documentation

## 2021-07-27 DIAGNOSIS — R269 Unspecified abnormalities of gait and mobility: Secondary | ICD-10-CM | POA: Insufficient documentation

## 2021-07-27 DIAGNOSIS — R2689 Other abnormalities of gait and mobility: Secondary | ICD-10-CM | POA: Diagnosis present
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
## 2021-07-30 ENCOUNTER — Ambulatory Visit: Payer: BC Managed Care – PPO | Admitting: Physical Therapy

## 2021-07-30 ENCOUNTER — Encounter: Payer: Self-pay | Admitting: Physical Therapy

## 2021-07-30 DIAGNOSIS — R269 Unspecified abnormalities of gait and mobility: Secondary | ICD-10-CM

## 2021-07-30 DIAGNOSIS — R2689 Other abnormalities of gait and mobility: Secondary | ICD-10-CM

## 2021-07-30 DIAGNOSIS — R262 Difficulty in walking, not elsewhere classified: Secondary | ICD-10-CM
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
## 2021-08-03 ENCOUNTER — Ambulatory Visit: Payer: BC Managed Care – PPO | Admitting: Physical Therapy

## 2021-08-03 DIAGNOSIS — Z0289 Encounter for other administrative examinations: Secondary | ICD-10-CM

## 2021-08-05 ENCOUNTER — Ambulatory Visit: Payer: BC Managed Care – PPO | Admitting: Physical Therapy

## 2021-08-05 ENCOUNTER — Encounter: Payer: Self-pay | Admitting: Physical Therapy

## 2021-08-05 DIAGNOSIS — R269 Unspecified abnormalities of gait and mobility: Secondary | ICD-10-CM | POA: Diagnosis not present

## 2021-08-05 DIAGNOSIS — R262 Difficulty in walking, not elsewhere classified: Secondary | ICD-10-CM

## 2021-08-05 DIAGNOSIS — R2689 Other abnormalities of gait and mobility: Secondary | ICD-10-CM
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
## 2021-08-10 ENCOUNTER — Ambulatory Visit: Payer: BC Managed Care – PPO | Admitting: Physical Therapy

## 2021-08-10 DIAGNOSIS — R269 Unspecified abnormalities of gait and mobility: Secondary | ICD-10-CM | POA: Diagnosis not present

## 2021-08-10 DIAGNOSIS — R262 Difficulty in walking, not elsewhere classified: Secondary | ICD-10-CM

## 2021-08-10 DIAGNOSIS — R2689 Other abnormalities of gait and mobility: Secondary | ICD-10-CM
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
## 2021-08-11 ENCOUNTER — Encounter: Payer: Self-pay | Admitting: Physical Therapy

## 2021-08-12 ENCOUNTER — Ambulatory Visit: Payer: BC Managed Care – PPO | Admitting: Physical Therapy

## 2021-08-12 DIAGNOSIS — R269 Unspecified abnormalities of gait and mobility: Secondary | ICD-10-CM

## 2021-08-12 DIAGNOSIS — R262 Difficulty in walking, not elsewhere classified: Secondary | ICD-10-CM

## 2021-08-12 DIAGNOSIS — R2689 Other abnormalities of gait and mobility: Secondary | ICD-10-CM
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
## 2021-08-13 ENCOUNTER — Encounter: Payer: Self-pay | Admitting: Physical Therapy

## 2021-08-17 ENCOUNTER — Encounter: Payer: Self-pay | Admitting: Physical Therapy

## 2021-08-17 ENCOUNTER — Ambulatory Visit: Payer: BC Managed Care – PPO | Admitting: Physical Therapy

## 2021-08-17 DIAGNOSIS — R2689 Other abnormalities of gait and mobility: Secondary | ICD-10-CM

## 2021-08-17 DIAGNOSIS — R262 Difficulty in walking, not elsewhere classified: Secondary | ICD-10-CM

## 2021-08-17 DIAGNOSIS — R269 Unspecified abnormalities of gait and mobility: Secondary | ICD-10-CM
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
## 2021-08-18 ENCOUNTER — Telehealth: Payer: Self-pay | Admitting: *Deleted

## 2021-08-18 NOTE — Telephone Encounter (Signed)
I spoke to the patient. States his attorney will actually need medical records. He is going to call back with the practice name and fax number. He understands a medical release form will be required.

## 2021-08-18 NOTE — Telephone Encounter (Signed)
Patient called need a work note for March 17 stating that Dr Terrace Arabia took him out of work Please call [phone]

## 2021-08-19 ENCOUNTER — Ambulatory Visit: Payer: BC Managed Care – PPO | Attending: Neurology | Admitting: Physical Therapy

## 2021-08-19 ENCOUNTER — Encounter: Payer: Self-pay | Admitting: Physical Therapy

## 2021-08-19 DIAGNOSIS — R2689 Other abnormalities of gait and mobility: Secondary | ICD-10-CM | POA: Diagnosis present

## 2021-08-19 DIAGNOSIS — R269 Unspecified abnormalities of gait and mobility: Secondary | ICD-10-CM | POA: Insufficient documentation

## 2021-08-19 DIAGNOSIS — R262 Difficulty in walking, not elsewhere classified: Secondary | ICD-10-CM | POA: Insufficient documentation
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
## 2021-08-19 NOTE — Telephone Encounter (Signed)
Done

## 2021-08-24 ENCOUNTER — Ambulatory Visit: Payer: BC Managed Care – PPO | Admitting: Physical Therapy

## 2021-08-24 ENCOUNTER — Encounter: Payer: Self-pay | Admitting: Physical Therapy

## 2021-08-24 DIAGNOSIS — R262 Difficulty in walking, not elsewhere classified: Secondary | ICD-10-CM

## 2021-08-24 DIAGNOSIS — R2689 Other abnormalities of gait and mobility: Secondary | ICD-10-CM

## 2021-08-24 DIAGNOSIS — R269 Unspecified abnormalities of gait and mobility: Secondary | ICD-10-CM | POA: Diagnosis not present
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
## 2021-08-26 ENCOUNTER — Encounter: Payer: Self-pay | Admitting: Physical Therapy

## 2021-08-26 ENCOUNTER — Ambulatory Visit: Payer: BC Managed Care – PPO | Admitting: Physical Therapy

## 2021-08-26 DIAGNOSIS — R2689 Other abnormalities of gait and mobility: Secondary | ICD-10-CM

## 2021-08-26 DIAGNOSIS — R262 Difficulty in walking, not elsewhere classified: Secondary | ICD-10-CM

## 2021-08-26 DIAGNOSIS — R269 Unspecified abnormalities of gait and mobility: Secondary | ICD-10-CM
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
## 2021-08-31 ENCOUNTER — Ambulatory Visit: Payer: BC Managed Care – PPO | Admitting: Physical Therapy

## 2021-08-31 ENCOUNTER — Encounter: Payer: Self-pay | Admitting: Physical Therapy

## 2021-08-31 DIAGNOSIS — R2689 Other abnormalities of gait and mobility: Secondary | ICD-10-CM

## 2021-08-31 DIAGNOSIS — R269 Unspecified abnormalities of gait and mobility: Secondary | ICD-10-CM | POA: Diagnosis not present

## 2021-08-31 DIAGNOSIS — R262 Difficulty in walking, not elsewhere classified: Secondary | ICD-10-CM
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
## 2021-09-02 ENCOUNTER — Ambulatory Visit: Payer: BC Managed Care – PPO | Admitting: Physical Therapy

## 2021-09-03 ENCOUNTER — Encounter: Payer: Self-pay | Admitting: Neurology

## 2021-09-03 ENCOUNTER — Ambulatory Visit (INDEPENDENT_AMBULATORY_CARE_PROVIDER_SITE_OTHER): Payer: BC Managed Care – PPO | Admitting: Neurology

## 2021-09-03 VITALS — BP 157/90 | HR 77 | Ht 66.0 in | Wt 174.5 lb

## 2021-09-03 DIAGNOSIS — R269 Unspecified abnormalities of gait and mobility: Secondary | ICD-10-CM | POA: Diagnosis not present

## 2021-09-03 DIAGNOSIS — G20 Parkinson's disease: Secondary | ICD-10-CM

## 2021-09-03 MED ORDER — RASAGILINE MESYLATE 1 MG PO TABS
1.0000 mg | ORAL_TABLET | Freq: Every day | ORAL | 11 refills | Status: DC
Start: 2021-09-03 — End: 2022-08-31

## 2021-09-03 NOTE — Patient Instructions (Signed)
[email or web address]  Corticobasal degeneration, also called corticobasal syndrome, is a rare condition in which areas of your brain shrink and your nerve cells degenerate and die over time. The disease affects the area of the brain that processes information and brain structures that control movement. This degeneration results in growing difficulty in movement on one or both sides of your body.  The condition may cause you to have poor coordination, stiffness, difficulty thinking, trouble with speech or language, or other problems.  Products & Services A Book: [facility] Book, 5th Edition Show more products from [facility] Symptoms Signs and symptoms of corticobasal degeneration (corticobasal syndrome) include:  Difficulty moving on one or both sides of the body, which gets worse over time Poor coordination Trouble with balance Stiffness Abnormal postures of the hands or feet, such as a hand forming a clenched fist Muscle jerks Difficulty swallowing Abnormal eye movements Trouble with thinking and language skills Speech problems, such as slow and halting speech Difficulty swallowing Corticobasal degeneration progresses over six to eight years. Eventually, people with corticobasal degeneration lose the ability to walk.  Request an appointment Causes Corticobasal degeneration (corticobasal syndrome) can be caused by several underlying pathologies. Most commonly, corticobasal degeneration is characterized by a buildup of tau in brain cells, which may lead to their deterioration and the symptoms of corticobasal degeneration. Half of the people who have signs and symptoms of corticobasal degeneration have corticobasal degeneration. The second most common cause of corticobasal degeneration is atypical Alzheimer's disease.  Other causes include progressive supranuclear palsy, Pick's disease and  Creutzfeldt-Jakob disease.  Complications The symptoms of corticobasal degeneration (corticobasal syndrome) progress to serious complications, such as pneumonia, blood clots in the lungs, or sepsis, a life-threatening response to an infection. Corticobasal degeneration complications ultimately lead to death.   [facility] Staff Corticobasal degeneration (corticobasal syndrome) care at [facility]  Request an appointment Diagnosis & treatment Feb. 22, 2022
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
## 2021-09-07 ENCOUNTER — Ambulatory Visit: Payer: BC Managed Care – PPO | Admitting: Physical Therapy

## 2021-09-07 ENCOUNTER — Encounter: Payer: Self-pay | Admitting: Physical Therapy

## 2021-09-07 DIAGNOSIS — R2689 Other abnormalities of gait and mobility: Secondary | ICD-10-CM

## 2021-09-07 DIAGNOSIS — R269 Unspecified abnormalities of gait and mobility: Secondary | ICD-10-CM

## 2021-09-07 DIAGNOSIS — R262 Difficulty in walking, not elsewhere classified: Secondary | ICD-10-CM
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
## 2021-09-09 ENCOUNTER — Ambulatory Visit: Payer: BC Managed Care – PPO | Admitting: Physical Therapy

## 2021-09-09 ENCOUNTER — Encounter: Payer: Self-pay | Admitting: Physical Therapy

## 2021-09-09 DIAGNOSIS — R2689 Other abnormalities of gait and mobility: Secondary | ICD-10-CM

## 2021-09-09 DIAGNOSIS — R262 Difficulty in walking, not elsewhere classified: Secondary | ICD-10-CM

## 2021-09-09 DIAGNOSIS — R269 Unspecified abnormalities of gait and mobility: Secondary | ICD-10-CM | POA: Diagnosis not present
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
## 2021-09-14 ENCOUNTER — Ambulatory Visit: Payer: BC Managed Care – PPO | Admitting: Physical Therapy

## 2021-09-14 ENCOUNTER — Encounter: Payer: Self-pay | Admitting: Physical Therapy

## 2021-09-14 DIAGNOSIS — R269 Unspecified abnormalities of gait and mobility: Secondary | ICD-10-CM | POA: Diagnosis not present

## 2021-09-14 DIAGNOSIS — R262 Difficulty in walking, not elsewhere classified: Secondary | ICD-10-CM

## 2021-09-14 DIAGNOSIS — R2689 Other abnormalities of gait and mobility: Secondary | ICD-10-CM
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
## 2021-09-15 ENCOUNTER — Ambulatory Visit: Payer: BC Managed Care – PPO | Admitting: Neurology

## 2021-09-16 ENCOUNTER — Telehealth: Payer: Self-pay | Admitting: Neurology

## 2021-09-16 ENCOUNTER — Ambulatory Visit: Payer: BC Managed Care – PPO | Admitting: Physical Therapy

## 2021-09-16 NOTE — Telephone Encounter (Signed)
Referral sent to [facility] [phone] 

## 2021-09-17 ENCOUNTER — Telehealth: Payer: Self-pay | Admitting: *Deleted

## 2021-09-17 NOTE — Telephone Encounter (Signed)
I faxed pt [facility] form on 09/17/21

## 2021-09-23 ENCOUNTER — Ambulatory Visit: Payer: BC Managed Care – PPO | Attending: Neurology | Admitting: Physical Therapy

## 2021-09-23 DIAGNOSIS — R269 Unspecified abnormalities of gait and mobility: Secondary | ICD-10-CM | POA: Diagnosis present

## 2021-09-23 DIAGNOSIS — R2689 Other abnormalities of gait and mobility: Secondary | ICD-10-CM | POA: Diagnosis present

## 2021-09-23 DIAGNOSIS — R262 Difficulty in walking, not elsewhere classified: Secondary | ICD-10-CM | POA: Insufficient documentation
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
## 2021-09-24 ENCOUNTER — Encounter: Payer: Self-pay | Admitting: Physical Therapy

## 2021-09-28 ENCOUNTER — Ambulatory Visit: Payer: BC Managed Care – PPO | Admitting: Physical Therapy

## 2021-09-30 ENCOUNTER — Encounter: Payer: Self-pay | Admitting: Physical Therapy

## 2021-09-30 ENCOUNTER — Ambulatory Visit: Payer: BC Managed Care – PPO | Admitting: Physical Therapy

## 2021-09-30 DIAGNOSIS — R262 Difficulty in walking, not elsewhere classified: Secondary | ICD-10-CM

## 2021-09-30 DIAGNOSIS — R269 Unspecified abnormalities of gait and mobility: Secondary | ICD-10-CM | POA: Diagnosis not present

## 2021-09-30 DIAGNOSIS — R2689 Other abnormalities of gait and mobility: Secondary | ICD-10-CM
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
## 2021-10-06 ENCOUNTER — Ambulatory Visit: Payer: BC Managed Care – PPO | Admitting: Physical Therapy

## 2021-10-06 ENCOUNTER — Encounter: Payer: Self-pay | Admitting: Physical Therapy

## 2021-10-06 DIAGNOSIS — R2689 Other abnormalities of gait and mobility: Secondary | ICD-10-CM

## 2021-10-06 DIAGNOSIS — R269 Unspecified abnormalities of gait and mobility: Secondary | ICD-10-CM | POA: Diagnosis not present

## 2021-10-06 DIAGNOSIS — R262 Difficulty in walking, not elsewhere classified: Secondary | ICD-10-CM
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
## 2021-10-08 ENCOUNTER — Ambulatory Visit: Payer: BC Managed Care – PPO | Admitting: Physical Therapy

## 2021-10-08 DIAGNOSIS — R262 Difficulty in walking, not elsewhere classified: Secondary | ICD-10-CM

## 2021-10-08 DIAGNOSIS — R2689 Other abnormalities of gait and mobility: Secondary | ICD-10-CM

## 2021-10-08 DIAGNOSIS — R269 Unspecified abnormalities of gait and mobility: Secondary | ICD-10-CM
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
## 2021-10-10 ENCOUNTER — Encounter: Payer: Self-pay | Admitting: Physical Therapy

## 2021-10-13 ENCOUNTER — Ambulatory Visit: Payer: BC Managed Care – PPO | Admitting: Physical Therapy

## 2021-10-15 ENCOUNTER — Encounter: Payer: BC Managed Care – PPO | Admitting: Physical Therapy

## 2021-10-20 ENCOUNTER — Encounter: Payer: Self-pay | Admitting: Physical Therapy

## 2021-10-20 ENCOUNTER — Ambulatory Visit: Payer: BC Managed Care – PPO | Attending: Neurology | Admitting: Physical Therapy

## 2021-10-20 DIAGNOSIS — R269 Unspecified abnormalities of gait and mobility: Secondary | ICD-10-CM | POA: Insufficient documentation

## 2021-10-20 DIAGNOSIS — R262 Difficulty in walking, not elsewhere classified: Secondary | ICD-10-CM | POA: Diagnosis present

## 2021-10-20 DIAGNOSIS — R2689 Other abnormalities of gait and mobility: Secondary | ICD-10-CM | POA: Diagnosis present
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
## 2021-10-22 ENCOUNTER — Ambulatory Visit: Payer: BC Managed Care – PPO | Admitting: Physical Therapy

## 2021-10-22 DIAGNOSIS — R2689 Other abnormalities of gait and mobility: Secondary | ICD-10-CM

## 2021-10-22 DIAGNOSIS — R269 Unspecified abnormalities of gait and mobility: Secondary | ICD-10-CM | POA: Diagnosis not present

## 2021-10-22 DIAGNOSIS — R262 Difficulty in walking, not elsewhere classified: Secondary | ICD-10-CM
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
## 2021-10-24 ENCOUNTER — Encounter: Payer: Self-pay | Admitting: Physical Therapy

## 2021-10-26 NOTE — Addendum Note (Signed)
Addended by: Valentina Gu T on: 10/26/2021 03:41 PM   Modules accepted: Orders

## 2021-10-27 ENCOUNTER — Ambulatory Visit: Payer: BC Managed Care – PPO | Admitting: Physical Therapy

## 2021-10-27 ENCOUNTER — Encounter: Payer: Self-pay | Admitting: Physical Therapy

## 2021-10-27 DIAGNOSIS — R2689 Other abnormalities of gait and mobility: Secondary | ICD-10-CM

## 2021-10-27 DIAGNOSIS — R269 Unspecified abnormalities of gait and mobility: Secondary | ICD-10-CM

## 2021-10-27 DIAGNOSIS — R262 Difficulty in walking, not elsewhere classified: Secondary | ICD-10-CM
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
## 2021-10-29 ENCOUNTER — Ambulatory Visit: Payer: BC Managed Care – PPO | Admitting: Physical Therapy

## 2021-10-29 ENCOUNTER — Telehealth: Payer: Self-pay | Admitting: *Deleted

## 2021-10-29 DIAGNOSIS — R262 Difficulty in walking, not elsewhere classified: Secondary | ICD-10-CM

## 2021-10-29 DIAGNOSIS — R269 Unspecified abnormalities of gait and mobility: Secondary | ICD-10-CM

## 2021-10-29 DIAGNOSIS — R2689 Other abnormalities of gait and mobility: Secondary | ICD-10-CM
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
## 2021-10-29 NOTE — Telephone Encounter (Signed)
Pt [employer] form ready for p/u. I called pt not able to leave a message mailbox full.

## 2021-10-29 NOTE — Telephone Encounter (Signed)
Pt returned call and was informed that the forms were in the front ready to be picked up. Pt verbalized appreciation.

## 2021-11-03 ENCOUNTER — Encounter: Payer: Self-pay | Admitting: Physical Therapy

## 2021-11-03 ENCOUNTER — Ambulatory Visit: Payer: BC Managed Care – PPO | Admitting: Physical Therapy

## 2021-11-03 DIAGNOSIS — R269 Unspecified abnormalities of gait and mobility: Secondary | ICD-10-CM | POA: Diagnosis not present

## 2021-11-03 DIAGNOSIS — R262 Difficulty in walking, not elsewhere classified: Secondary | ICD-10-CM

## 2021-11-03 DIAGNOSIS — R2689 Other abnormalities of gait and mobility: Secondary | ICD-10-CM
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
## 2021-11-05 ENCOUNTER — Ambulatory Visit: Payer: BC Managed Care – PPO | Admitting: Physical Therapy

## 2021-11-05 ENCOUNTER — Encounter: Payer: Self-pay | Admitting: Physical Therapy

## 2021-11-05 VITALS — BP 140/76

## 2021-11-05 DIAGNOSIS — R269 Unspecified abnormalities of gait and mobility: Secondary | ICD-10-CM | POA: Diagnosis not present

## 2021-11-05 DIAGNOSIS — R262 Difficulty in walking, not elsewhere classified: Secondary | ICD-10-CM

## 2021-11-05 DIAGNOSIS — R2689 Other abnormalities of gait and mobility: Secondary | ICD-10-CM
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
## 2021-11-10 ENCOUNTER — Ambulatory Visit: Payer: BC Managed Care – PPO | Admitting: Physical Therapy

## 2021-11-10 DIAGNOSIS — R269 Unspecified abnormalities of gait and mobility: Secondary | ICD-10-CM

## 2021-11-10 DIAGNOSIS — R2689 Other abnormalities of gait and mobility: Secondary | ICD-10-CM

## 2021-11-10 DIAGNOSIS — R262 Difficulty in walking, not elsewhere classified: Secondary | ICD-10-CM
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
## 2021-11-12 ENCOUNTER — Encounter: Payer: Self-pay | Admitting: Physical Therapy

## 2021-11-12 ENCOUNTER — Ambulatory Visit: Payer: BC Managed Care – PPO | Admitting: Physical Therapy

## 2021-11-12 DIAGNOSIS — R269 Unspecified abnormalities of gait and mobility: Secondary | ICD-10-CM | POA: Diagnosis not present

## 2021-11-12 DIAGNOSIS — R2689 Other abnormalities of gait and mobility: Secondary | ICD-10-CM

## 2021-11-12 DIAGNOSIS — R262 Difficulty in walking, not elsewhere classified: Secondary | ICD-10-CM
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
## 2021-11-17 ENCOUNTER — Ambulatory Visit: Payer: BC Managed Care – PPO | Admitting: Physical Therapy

## 2021-11-19 ENCOUNTER — Encounter: Payer: BC Managed Care – PPO | Admitting: Physical Therapy

## 2021-11-24 DIAGNOSIS — Z0289 Encounter for other administrative examinations: Secondary | ICD-10-CM

## 2021-12-02 ENCOUNTER — Ambulatory Visit: Payer: BC Managed Care – PPO | Attending: Neurology | Admitting: Physical Therapy

## 2021-12-02 DIAGNOSIS — R269 Unspecified abnormalities of gait and mobility: Secondary | ICD-10-CM | POA: Diagnosis not present

## 2021-12-02 DIAGNOSIS — R2689 Other abnormalities of gait and mobility: Secondary | ICD-10-CM | POA: Diagnosis present

## 2021-12-02 DIAGNOSIS — R262 Difficulty in walking, not elsewhere classified: Secondary | ICD-10-CM | POA: Insufficient documentation
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
## 2021-12-05 ENCOUNTER — Encounter: Payer: Self-pay | Admitting: Physical Therapy

## 2021-12-08 ENCOUNTER — Encounter: Payer: BC Managed Care – PPO | Admitting: Physical Therapy

## 2021-12-08 ENCOUNTER — Ambulatory Visit: Payer: BC Managed Care – PPO | Admitting: Physical Therapy

## 2021-12-08 DIAGNOSIS — R262 Difficulty in walking, not elsewhere classified: Secondary | ICD-10-CM

## 2021-12-08 DIAGNOSIS — R269 Unspecified abnormalities of gait and mobility: Secondary | ICD-10-CM

## 2021-12-08 DIAGNOSIS — R2689 Other abnormalities of gait and mobility: Secondary | ICD-10-CM
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
## 2021-12-09 ENCOUNTER — Encounter: Payer: Self-pay | Admitting: Physical Therapy

## 2021-12-17 ENCOUNTER — Ambulatory Visit: Payer: BC Managed Care – PPO | Admitting: Physical Therapy

## 2021-12-23 ENCOUNTER — Ambulatory Visit: Payer: BC Managed Care – PPO | Attending: Neurology | Admitting: Physical Therapy

## 2021-12-23 ENCOUNTER — Encounter: Payer: Self-pay | Admitting: Physical Therapy

## 2021-12-23 DIAGNOSIS — R2689 Other abnormalities of gait and mobility: Secondary | ICD-10-CM | POA: Diagnosis present

## 2021-12-23 DIAGNOSIS — R262 Difficulty in walking, not elsewhere classified: Secondary | ICD-10-CM | POA: Diagnosis present

## 2021-12-23 DIAGNOSIS — R269 Unspecified abnormalities of gait and mobility: Secondary | ICD-10-CM | POA: Insufficient documentation
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
## 2021-12-29 ENCOUNTER — Telehealth: Payer: Self-pay | Admitting: Neurology

## 2021-12-29 NOTE — Telephone Encounter (Signed)
Short term disability forms faxed to [place] [employer] (fax# [phone])

## 2021-12-30 ENCOUNTER — Ambulatory Visit: Payer: BC Managed Care – PPO | Admitting: Physical Therapy
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
## 2022-01-13 ENCOUNTER — Ambulatory Visit: Payer: BC Managed Care – PPO | Admitting: Physical Therapy

## 2022-01-13 DIAGNOSIS — R269 Unspecified abnormalities of gait and mobility: Secondary | ICD-10-CM | POA: Diagnosis not present

## 2022-01-13 DIAGNOSIS — R262 Difficulty in walking, not elsewhere classified: Secondary | ICD-10-CM

## 2022-01-13 DIAGNOSIS — R2689 Other abnormalities of gait and mobility: Secondary | ICD-10-CM
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
## 2022-01-14 ENCOUNTER — Encounter: Payer: Self-pay | Admitting: Physical Therapy

## 2022-01-20 ENCOUNTER — Encounter: Payer: Self-pay | Admitting: Physical Therapy

## 2022-01-20 ENCOUNTER — Ambulatory Visit: Payer: BC Managed Care – PPO | Attending: Neurology | Admitting: Physical Therapy

## 2022-01-20 DIAGNOSIS — R269 Unspecified abnormalities of gait and mobility: Secondary | ICD-10-CM | POA: Diagnosis present

## 2022-01-20 DIAGNOSIS — R262 Difficulty in walking, not elsewhere classified: Secondary | ICD-10-CM | POA: Diagnosis present

## 2022-01-20 DIAGNOSIS — R2689 Other abnormalities of gait and mobility: Secondary | ICD-10-CM | POA: Insufficient documentation
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
## 2022-01-27 ENCOUNTER — Encounter: Payer: Self-pay | Admitting: Physical Therapy

## 2022-01-27 ENCOUNTER — Ambulatory Visit: Payer: BC Managed Care – PPO | Admitting: Physical Therapy

## 2022-01-27 DIAGNOSIS — R262 Difficulty in walking, not elsewhere classified: Secondary | ICD-10-CM

## 2022-01-27 DIAGNOSIS — R2689 Other abnormalities of gait and mobility: Secondary | ICD-10-CM

## 2022-01-27 DIAGNOSIS — R269 Unspecified abnormalities of gait and mobility: Secondary | ICD-10-CM
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
## 2022-02-03 ENCOUNTER — Encounter: Payer: Self-pay | Admitting: Physical Therapy

## 2022-02-03 ENCOUNTER — Ambulatory Visit: Payer: BC Managed Care – PPO | Admitting: Physical Therapy

## 2022-02-03 DIAGNOSIS — R269 Unspecified abnormalities of gait and mobility: Secondary | ICD-10-CM

## 2022-02-03 DIAGNOSIS — R2689 Other abnormalities of gait and mobility: Secondary | ICD-10-CM

## 2022-02-03 DIAGNOSIS — R262 Difficulty in walking, not elsewhere classified: Secondary | ICD-10-CM
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
## 2022-02-10 ENCOUNTER — Ambulatory Visit: Payer: BC Managed Care – PPO | Admitting: Physical Therapy

## 2022-02-10 DIAGNOSIS — R262 Difficulty in walking, not elsewhere classified: Secondary | ICD-10-CM

## 2022-02-10 DIAGNOSIS — R269 Unspecified abnormalities of gait and mobility: Secondary | ICD-10-CM

## 2022-02-10 DIAGNOSIS — R2689 Other abnormalities of gait and mobility: Secondary | ICD-10-CM

## 2022-02-13 ENCOUNTER — Encounter: Payer: Self-pay | Admitting: Physical Therapy
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
## 2022-02-17 ENCOUNTER — Encounter: Payer: BC Managed Care – PPO | Admitting: Physical Therapy

## 2022-02-24 ENCOUNTER — Ambulatory Visit: Payer: BC Managed Care – PPO | Attending: Neurology | Admitting: Physical Therapy

## 2022-02-24 ENCOUNTER — Encounter: Payer: Self-pay | Admitting: Physical Therapy

## 2022-02-24 VITALS — BP 144/90

## 2022-02-24 DIAGNOSIS — R2689 Other abnormalities of gait and mobility: Secondary | ICD-10-CM | POA: Insufficient documentation

## 2022-02-24 DIAGNOSIS — R269 Unspecified abnormalities of gait and mobility: Secondary | ICD-10-CM | POA: Diagnosis present

## 2022-02-24 DIAGNOSIS — R262 Difficulty in walking, not elsewhere classified: Secondary | ICD-10-CM | POA: Insufficient documentation
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
## 2022-03-01 ENCOUNTER — Ambulatory Visit: Payer: BC Managed Care – PPO | Admitting: Adult Health

## 2022-03-01 ENCOUNTER — Telehealth: Payer: Self-pay

## 2022-03-01 NOTE — Telephone Encounter (Signed)
Noted, thank you

## 2022-03-01 NOTE — Telephone Encounter (Signed)
Called pt and was not able to LVM due to mailbox being full. Will try again in an hour.

## 2022-03-01 NOTE — Telephone Encounter (Signed)
Called pt again and was still not able to LVM.

## 2022-03-01 NOTE — Telephone Encounter (Signed)
[  7:37 AM] Wyatt Medina good morning! patient Wyatt Medina (DOB Oct 08, 1960) is scheduled to see me this afternoon. he is currently being followed by [facility] neurology (was referred by Dr. Krista Blue at prior visit), reviewed their recent OV note and no reason to be seeing both office's at this point as we would not be doing anything different. can cancel this afternoon's visit

## 2022-03-02 ENCOUNTER — Telehealth: Payer: Self-pay | Admitting: *Deleted

## 2022-03-02 NOTE — Telephone Encounter (Signed)
Pt short term form faxed 03/02/2022

## 2022-03-08 ENCOUNTER — Ambulatory Visit: Payer: BC Managed Care – PPO | Attending: Family Medicine

## 2022-03-08 DIAGNOSIS — R471 Dysarthria and anarthria: Secondary | ICD-10-CM | POA: Diagnosis present

## 2022-03-08 DIAGNOSIS — R1312 Dysphagia, oropharyngeal phase: Secondary | ICD-10-CM
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
## 2022-03-08 NOTE — Patient Instructions (Addendum)
  You said  you were coughing on average once/day with food and drink. I did not see any coughing today with the fig bar and the drink. HOWEVER, I am going to recommend a swallow study at [facility] with Deneise Lever, another speech language pathologist like myself. They will call you with those details to get that scheduled. This test will see if there are things you can do right now to decrease the coughing you are doing each day.  She may want to see you for swallowing therapy  For now, take smaller bites with your meals, and smaller sips when you are drinking, and with your meals.    Olean Ree may also want to see you for therapy for helping you in the future with your speech. I did not have time to do both swallowing and speech evaluations today. She will take good care of you!

## 2022-03-23 ENCOUNTER — Ambulatory Visit: Payer: BC Managed Care – PPO | Admitting: Speech Pathology

## 2022-03-26 ENCOUNTER — Ambulatory Visit: Payer: BC Managed Care – PPO | Admitting: Speech Pathology

## 2022-03-29 ENCOUNTER — Other Ambulatory Visit: Payer: Self-pay | Admitting: Student

## 2022-03-29 ENCOUNTER — Ambulatory Visit: Payer: BC Managed Care – PPO | Attending: Family Medicine | Admitting: Speech Pathology

## 2022-03-29 DIAGNOSIS — G3185 Corticobasal degeneration: Secondary | ICD-10-CM | POA: Diagnosis present

## 2022-03-29 DIAGNOSIS — R471 Dysarthria and anarthria: Secondary | ICD-10-CM | POA: Diagnosis present

## 2022-03-29 DIAGNOSIS — R1310 Dysphagia, unspecified: Secondary | ICD-10-CM

## 2022-03-29 DIAGNOSIS — R1312 Dysphagia, oropharyngeal phase: Secondary | ICD-10-CM | POA: Diagnosis present
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
## 2022-03-31 ENCOUNTER — Ambulatory Visit
Admission: RE | Admit: 2022-03-31 | Discharge: 2022-03-31 | Disposition: A | Discharge status: Home / Self Care | Payer: BC Managed Care – PPO | Source: Ambulatory Visit | Attending: Student | Admitting: Student

## 2022-03-31 ENCOUNTER — Ambulatory Visit: Payer: BC Managed Care – PPO | Admitting: Speech Pathology

## 2022-03-31 DIAGNOSIS — R1310 Dysphagia, unspecified: Secondary | ICD-10-CM | POA: Diagnosis present
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
## 2022-04-05 ENCOUNTER — Ambulatory Visit: Payer: BC Managed Care – PPO | Admitting: Speech Pathology

## 2022-04-06 ENCOUNTER — Telehealth: Payer: Self-pay | Admitting: Speech Pathology

## 2022-04-06 NOTE — Telephone Encounter (Signed)
Pt missed with no call/no show his session yesterday.   Pt stated he forgot. Confirmed with pt upcoming appt on Friday 3/29 at [place] B. Rutherford Nail, M.S., CCC-SLP, Mining engineer Certified Brain Injury [place]  [place] Office [phone] Ascom [phone] Fax [phone]

## 2022-04-08 ENCOUNTER — Ambulatory Visit: Payer: BC Managed Care – PPO | Admitting: Speech Pathology

## 2022-04-12 ENCOUNTER — Ambulatory Visit: Payer: BC Managed Care – PPO | Admitting: Speech Pathology

## 2022-04-15 ENCOUNTER — Ambulatory Visit: Payer: BC Managed Care – PPO | Admitting: Speech Pathology

## 2022-04-16 ENCOUNTER — Ambulatory Visit: Payer: BC Managed Care – PPO | Admitting: Speech Pathology

## 2022-04-16 DIAGNOSIS — G3185 Corticobasal degeneration: Secondary | ICD-10-CM

## 2022-04-16 DIAGNOSIS — R1312 Dysphagia, oropharyngeal phase: Secondary | ICD-10-CM | POA: Diagnosis not present

## 2022-04-16 DIAGNOSIS — R471 Dysarthria and anarthria: Secondary | ICD-10-CM
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
## 2022-04-19 ENCOUNTER — Ambulatory Visit: Payer: BC Managed Care – PPO | Admitting: Speech Pathology

## 2022-04-21 ENCOUNTER — Ambulatory Visit: Payer: BC Managed Care – PPO | Admitting: Speech Pathology

## 2022-04-26 ENCOUNTER — Ambulatory Visit: Payer: BC Managed Care – PPO | Admitting: Speech Pathology

## 2022-04-28 ENCOUNTER — Ambulatory Visit: Payer: BC Managed Care – PPO | Admitting: Speech Pathology

## 2022-05-03 ENCOUNTER — Ambulatory Visit: Payer: BC Managed Care – PPO | Admitting: Speech Pathology

## 2022-05-05 ENCOUNTER — Ambulatory Visit: Payer: BC Managed Care – PPO | Admitting: Speech Pathology

## 2022-05-06 ENCOUNTER — Ambulatory Visit: Payer: BC Managed Care – PPO | Admitting: Speech Pathology

## 2022-05-10 ENCOUNTER — Ambulatory Visit: Payer: BC Managed Care – PPO | Admitting: Speech Pathology

## 2022-05-12 ENCOUNTER — Ambulatory Visit: Payer: BC Managed Care – PPO | Admitting: Speech Pathology

## 2022-05-18 ENCOUNTER — Ambulatory Visit: Payer: BC Managed Care – PPO | Admitting: Speech Pathology

## 2022-05-20 ENCOUNTER — Ambulatory Visit: Payer: BC Managed Care – PPO | Attending: Family Medicine | Admitting: Speech Pathology

## 2022-05-20 DIAGNOSIS — R471 Dysarthria and anarthria: Secondary | ICD-10-CM | POA: Diagnosis present

## 2022-05-20 DIAGNOSIS — R1312 Dysphagia, oropharyngeal phase: Secondary | ICD-10-CM | POA: Diagnosis present

## 2022-05-20 DIAGNOSIS — R1313 Dysphagia, pharyngeal phase: Secondary | ICD-10-CM

## 2022-05-20 DIAGNOSIS — G3185 Corticobasal degeneration: Secondary | ICD-10-CM | POA: Diagnosis present
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
## 2022-05-24 ENCOUNTER — Ambulatory Visit: Payer: BC Managed Care – PPO | Admitting: Speech Pathology

## 2022-05-26 ENCOUNTER — Ambulatory Visit: Payer: BC Managed Care – PPO | Admitting: Speech Pathology

## 2022-05-31 ENCOUNTER — Ambulatory Visit: Payer: BC Managed Care – PPO | Admitting: Speech Pathology

## 2022-06-02 ENCOUNTER — Ambulatory Visit: Payer: BC Managed Care – PPO | Admitting: Speech Pathology

## 2022-06-07 ENCOUNTER — Ambulatory Visit: Payer: BC Managed Care – PPO | Admitting: Speech Pathology

## 2022-06-09 ENCOUNTER — Ambulatory Visit: Payer: BC Managed Care – PPO | Admitting: Speech Pathology

## 2022-06-15 ENCOUNTER — Ambulatory Visit: Payer: BC Managed Care – PPO | Admitting: Speech Pathology

## 2022-06-17 ENCOUNTER — Ambulatory Visit: Payer: BC Managed Care – PPO | Admitting: Speech Pathology

## 2022-06-21 ENCOUNTER — Ambulatory Visit: Payer: BC Managed Care – PPO | Attending: Family Medicine | Admitting: Speech Pathology

## 2022-06-21 DIAGNOSIS — R471 Dysarthria and anarthria: Secondary | ICD-10-CM | POA: Insufficient documentation

## 2022-06-21 DIAGNOSIS — G3185 Corticobasal degeneration: Secondary | ICD-10-CM | POA: Insufficient documentation

## 2022-06-21 DIAGNOSIS — R1312 Dysphagia, oropharyngeal phase: Secondary | ICD-10-CM | POA: Diagnosis present
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
## 2022-06-23 ENCOUNTER — Ambulatory Visit: Payer: BC Managed Care – PPO | Admitting: Speech Pathology

## 2022-06-28 ENCOUNTER — Ambulatory Visit: Payer: BC Managed Care – PPO | Admitting: Speech Pathology

## 2022-06-30 ENCOUNTER — Ambulatory Visit: Payer: BC Managed Care – PPO | Admitting: Speech Pathology

## 2022-08-31 ENCOUNTER — Other Ambulatory Visit: Payer: Self-pay | Admitting: Neurology

## 2022-11-19 ENCOUNTER — Ambulatory Visit
Admission: EM | Admit: 2022-11-19 | Discharge: 2022-11-19 | Disposition: A | Discharge status: Home / Self Care | Payer: 59 | Attending: Family Medicine | Admitting: Family Medicine

## 2022-11-19 DIAGNOSIS — S61412A Laceration without foreign body of left hand, initial encounter: Secondary | ICD-10-CM | POA: Diagnosis not present

## 2022-11-19 DIAGNOSIS — Z23 Encounter for immunization: Secondary | ICD-10-CM | POA: Diagnosis not present

## 2022-11-19 MED ORDER — DOXYCYCLINE HYCLATE 100 MG PO CAPS: 100.0000 mg | ORAL_CAPSULE | Freq: Two times a day (BID) | ORAL | 0 refills | Status: AC

## 2022-11-19 MED ORDER — TETANUS-DIPHTH-ACELL PERTUSSIS 5-2.5-18.5 LF-MCG/0.5 IM SUSY
0.5000 mL | PREFILLED_SYRINGE | Freq: Once | INTRAMUSCULAR | Status: AC
  Administered 2022-11-19: 0.5 mL via INTRAMUSCULAR

## 2022-11-19 NOTE — Discharge Instructions (Signed)
Stop by the pharmacy to pick up your prescriptions.  Follow up with your primary care provider or return to urgent care to have sutures removed in 10-14 days.  Watch for signs and symptoms of infection like fever worsening redness or increasing pain. See handout for more information.   

## 2022-11-19 NOTE — ED Triage Notes (Signed)
Pt is with his dad.  Pt was moving a [employer]zall out of the back of his truck and fell over the equipment. Pt has a cut along the back of his left hand about 1 1/2-2 inches long.   Pt states that he fell around 4pm.
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
## 2022-11-21 DIAGNOSIS — S61412A Laceration without foreign body of left hand, initial encounter: Secondary | ICD-10-CM | POA: Diagnosis not present

## 2022-11-29 ENCOUNTER — Ambulatory Visit
Admission: EM | Admit: 2022-11-29 | Discharge: 2022-11-29 | Disposition: A | Discharge status: Home / Self Care | Payer: 59 | Attending: Family Medicine | Admitting: Family Medicine

## 2022-11-29 DIAGNOSIS — S61422D Laceration with foreign body of left hand, subsequent encounter: Secondary | ICD-10-CM | POA: Diagnosis not present

## 2022-11-29 DIAGNOSIS — Z5189 Encounter for other specified aftercare: Secondary | ICD-10-CM
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
## 2022-11-29 NOTE — ED Triage Notes (Signed)
Patient presents for suture removal 10 days after placement in left hand.  However, he states the sutures opened a few days ago.  Incision site looks wet with clear/cloudy drainage

## 2022-11-29 NOTE — Discharge Instructions (Addendum)
Call the Wound Healing Center at [facility] to schedule an appointment  Address: [address], [place], [place] [address] Phone: [phone]  If you are not seen in the next 5 days, return to the urgent care to have the last suture removed.

## 2023-01-03 ENCOUNTER — Ambulatory Visit: Payer: 59 | Admitting: Physician Assistant

## 2023-01-05 DIAGNOSIS — H532 Diplopia: Secondary | ICD-10-CM | POA: Insufficient documentation

## 2023-01-05 DIAGNOSIS — H5005 Alternating esotropia: Secondary | ICD-10-CM | POA: Insufficient documentation

## 2023-01-20 ENCOUNTER — Telehealth: Payer: Self-pay | Admitting: Neurology

## 2023-01-20 NOTE — Telephone Encounter (Signed)
 Pt states his Long Term Disability company needs a summary of some kind regarding his diagnosis and how Dr Terrace Arabia determined it.  Pt is asking that when the letter is ready he will come to the office for it and submit that to his Long Term Disability company

## 2023-01-20 NOTE — Telephone Encounter (Signed)
 Contacted pt back, he stated he needed information for his disability on how Dr Onita got his diagnoses, what test were done and recommendations. I advised I can print out his last OV from 08/2021 which summarizes his history, testing, assessment and MD plan, if he thinks that will help. He verbally understood and agreed. He requested notes be mailed and emailed to him (daughter email) at [email or web address].

## 2023-06-27 ENCOUNTER — Ambulatory Visit
Admission: EM | Admit: 2023-06-27 | Discharge: 2023-06-27 | Disposition: A | Discharge status: Home / Self Care | Attending: Emergency Medicine | Admitting: Emergency Medicine

## 2023-06-27 DIAGNOSIS — S51812A Laceration without foreign body of left forearm, initial encounter: Secondary | ICD-10-CM | POA: Diagnosis not present

## 2023-06-27 MED ORDER — DOXYCYCLINE HYCLATE 100 MG PO CAPS
100.0000 mg | ORAL_CAPSULE | Freq: Two times a day (BID) | ORAL | 0 refills | Status: AC
Start: 2023-06-27 — End: 2023-07-04

## 2023-06-27 NOTE — ED Triage Notes (Signed)
 Pt c/o L arm swelling & redness x2 days. Was seen at [facility] ED on 6/3 d/t LAC of LUE. Had 14 sutures placed.
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
## 2023-06-27 NOTE — Discharge Instructions (Addendum)
 Take the Doxycycline  twice daily with food for 7 days for your wound infection.  Monitor for any increase in redness, swelling, pus drainage, or if you develop a fever. If these conditions occur return for re-evaluation.  Stop applying topical antibiotic ointment and leave the area open to air.

## 2023-08-11 ENCOUNTER — Encounter: Attending: Physician Assistant | Admitting: Physician Assistant

## 2023-08-11 DIAGNOSIS — S51812A Laceration without foreign body of left forearm, initial encounter: Secondary | ICD-10-CM | POA: Insufficient documentation

## 2023-08-11 DIAGNOSIS — I10 Essential (primary) hypertension: Secondary | ICD-10-CM | POA: Diagnosis not present

## 2023-08-11 DIAGNOSIS — X58XXXA Exposure to other specified factors, initial encounter: Secondary | ICD-10-CM | POA: Diagnosis not present

## 2023-08-11 DIAGNOSIS — G20C Parkinsonism, unspecified: Secondary | ICD-10-CM | POA: Insufficient documentation

## 2024-04-28 IMAGING — NM NM DATSCAN
3 series · 18 of 18 positions shown · non-contrast
Comparison: None Available.

CLINICAL DATA: 60-year-old male with LEFT-sided weakness. Gait
instability

EXAM:
NUCLEAR MEDICINE BRAIN IMAGING WITH SPECT  (DaTscan )
TECHNIQUE: SPECT images of the brain were obtained after intravenous injection
of radiopharmaceutical. 4 hour post injection imaging. Appropriate
positioning.
130 mg BUOI STAT given orally for thyroid blockade.
RADIOPHARMACEUTICALS:  4.9 millicuries I 123 Ioflupane

[Series 1: wbr_bone dat scan · 4.1mm · 4.14mm/px · 6 of 128 frames shown]
[frame 11/128]
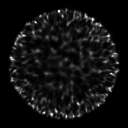
[frame 32/128]
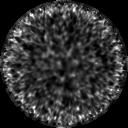
[frame 54/128]
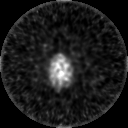
[frame 75/128]
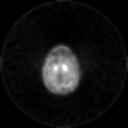
[frame 96/128]
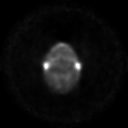
[frame 118/128]
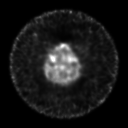

[Series 1: dat scan · 4.14mm/px · 6 of 120 frames shown]
[frame 11/120  full-range]
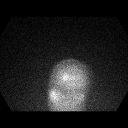
[frame 31/120  full-range]
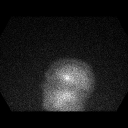
[frame 51/120  full-range]
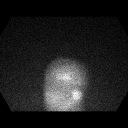
[frame 71/120  full-range]
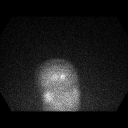
[frame 91/120  full-range]
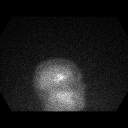
[frame 111/120  full-range]
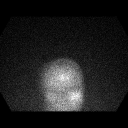

[Series 7: datquant results · 4.1mm · 4.14mm/px · 6 of 128 frames shown]
[frame 11/128]
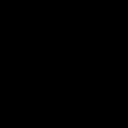
[frame 32/128]
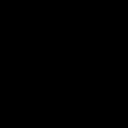
[frame 54/128]
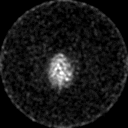
[frame 75/128]
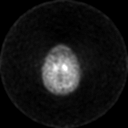
[frame 96/128]
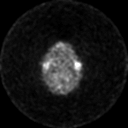
[frame 118/128]
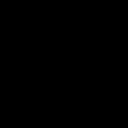

[18 of 18 positions shown; findings below may reference images not displayed]

FINDINGS: There is near absent radiotracer activity in the RIGHT putamen.
Marked decreased radiotracer activity in the head of the RIGHT
caudate nucleus compared to the LEFT. Minimal decreased activity in
the LEFT putamen
IMPRESSION: Marked decreased radiotracer activity in the RIGHT striatum. This
pattern has been associated with Parkinsonian syndrome pathology.

Of note, DaTSCAN is not diagnostic of Parkinsonian syndromes, which
remains a clinical diagnosis. DaTscan is an adjuvant test to aid in
the clinical diagnosis of Parkinsonian syndromes.
